# Patient Record
Sex: Male | Born: 1990
Health system: Southern US, Community
[De-identification: ages and names within clinical notes are randomized; demographics above are authoritative.]

---

## 2020-09-14 ENCOUNTER — Other Ambulatory Visit: Payer: Self-pay

## 2020-09-14 ENCOUNTER — Emergency Department (HOSPITAL_BASED_OUTPATIENT_CLINIC_OR_DEPARTMENT_OTHER): Payer: Worker's Compensation

## 2020-09-14 ENCOUNTER — Other Ambulatory Visit (HOSPITAL_BASED_OUTPATIENT_CLINIC_OR_DEPARTMENT_OTHER): Payer: Self-pay | Admitting: Physician Assistant

## 2020-09-14 ENCOUNTER — Encounter (HOSPITAL_BASED_OUTPATIENT_CLINIC_OR_DEPARTMENT_OTHER): Payer: Self-pay | Admitting: Emergency Medicine

## 2020-09-14 ENCOUNTER — Emergency Department (HOSPITAL_BASED_OUTPATIENT_CLINIC_OR_DEPARTMENT_OTHER)
Admission: EM | Admit: 2020-09-14 | Discharge: 2020-09-14 | Disposition: A | Discharge status: Home / Self Care | Payer: Worker's Compensation | Attending: Emergency Medicine | Admitting: Emergency Medicine

## 2020-09-14 DIAGNOSIS — S60932A Unspecified superficial injury of left thumb, initial encounter: Secondary | ICD-10-CM | POA: Diagnosis present

## 2020-09-14 DIAGNOSIS — Y990 Civilian activity done for income or pay: Secondary | ICD-10-CM | POA: Insufficient documentation

## 2020-09-14 DIAGNOSIS — W294XXA Contact with nail gun, initial encounter: Secondary | ICD-10-CM | POA: Diagnosis not present

## 2020-09-14 DIAGNOSIS — S61301A Unspecified open wound of left index finger with damage to nail, initial encounter: Secondary | ICD-10-CM | POA: Insufficient documentation

## 2020-09-14 DIAGNOSIS — S62522A Displaced fracture of distal phalanx of left thumb, initial encounter for closed fracture: Secondary | ICD-10-CM | POA: Diagnosis not present

## 2020-09-14 DIAGNOSIS — F17290 Nicotine dependence, other tobacco product, uncomplicated: Secondary | ICD-10-CM | POA: Diagnosis not present

## 2020-09-14 DIAGNOSIS — S62522B Displaced fracture of distal phalanx of left thumb, initial encounter for open fracture: Secondary | ICD-10-CM

## 2020-09-14 DIAGNOSIS — S6992XA Unspecified injury of left wrist, hand and finger(s), initial encounter: Secondary | ICD-10-CM

## 2020-09-14 MED ORDER — ONDANSETRON 4 MG PO TBDP: 4.0000 mg | ORAL_TABLET | Freq: Once | ORAL | Status: DC | PRN

## 2020-09-14 MED ORDER — CEPHALEXIN 500 MG PO CAPS: 500.0000 mg | ORAL_CAPSULE | Freq: Four times a day (QID) | ORAL | 0 refills | Status: DC

## 2020-09-14 MED ORDER — BUPIVACAINE HCL (PF) 0.5 % IJ SOLN
5.0000 mL | Freq: Once | INTRAMUSCULAR | Status: AC
  Administered 2020-09-14: 5 mL
  Filled 2020-09-14: qty 10

## 2020-09-14 MED ORDER — OXYCODONE-ACETAMINOPHEN 5-325 MG PO TABS
1.0000 | ORAL_TABLET | ORAL | Status: DC | PRN
  Administered 2020-09-14: 1 via ORAL
  Filled 2020-09-14: qty 1

## 2020-09-14 MED ORDER — OXYCODONE-ACETAMINOPHEN 5-325 MG PO TABS
1.0000 | ORAL_TABLET | Freq: Four times a day (QID) | ORAL | 0 refills | Status: DC | PRN
Start: 2020-09-14 — End: 2020-09-14

## 2020-09-14 MED ORDER — LIDOCAINE HCL (PF) 1 % IJ SOLN
5.0000 mL | Freq: Once | INTRAMUSCULAR | Status: AC
  Administered 2020-09-14: 5 mL
  Filled 2020-09-14: qty 5

## 2020-09-14 MED FILL — CEPHALEXIN 500 MG CAPSULE: 500 | 10 days supply | Qty: 40 | Fill #0

## 2020-09-14 MED FILL — OXYCODONE-ACETAMINOPHEN 5-3: 5-325 | 2 days supply | Qty: 10 | Fill #0

## 2020-09-14 NOTE — ED Provider Notes (Signed)
[facility] EMERGENCY DEPARTMENT Provider Note   CSN: [id number] Arrival date & time: 09/14/20  1228     History Chief Complaint  Patient presents with  . Finger Injury    Douglas Harris is a 29 y.o. male with no past medical history who presents today for evaluation of a left thumb injury.  He reports that at 8 AM this morning he accidentally shot his left thumb with a rivet gun at work.  He states this was the third place he has been to.  He had x-rays obtained at urgent care, however they did not send a disc.  Its tetanus was updated at urgent care.  He denies any other injuries.  He reports severe pain in his left thumb, states he has not been given any pain medication, did not eat breakfast or lunch today.    HPI     History reviewed. No pertinent past medical history.  There are no problems to display for this patient.   History reviewed. No pertinent surgical history.     No family history on file.  Social History   Tobacco Use  . Smoking status: Current Every Day Smoker    Types: Cigars  . Smokeless tobacco: Never Used  Substance Use Topics  . Alcohol use: Yes  . Drug use: Not on file    Home Medications Prior to Admission medications   Medication Sig Start Date End Date Taking? Authorizing Provider  cephALEXin (KEFLEX) 500 MG capsule Take 1 capsule (500 mg total) by mouth 4 (four) times daily for 10 days. 09/14/20 09/24/20  Cristina Gong, PA-C  oxyCODONE-acetaminophen (PERCOCET/ROXICET) 5-325 MG tablet Take 1 tablet by mouth every 6 (six) hours as needed for severe pain. 09/14/20   Cristina Gong, PA-C    Allergies    Patient has no known allergies.  Review of Systems   Review of Systems  Constitutional: Negative for chills, fever and unexpected weight change.  Skin: Positive for wound.  Neurological: Negative for weakness and headaches.  All other systems reviewed and are negative.   Physical Exam Updated Vital Signs BP  (!) 145/100   Pulse 83   Temp 98.6 F (37 C)   Resp 18   Ht 5\' 10"  (1.778 m)   Wt 97.5 kg   SpO2 100%   BMI 30.85 kg/m   Physical Exam Vitals and nursing note reviewed.  Constitutional:      General: He is not in acute distress. HENT:     Head: Normocephalic and atraumatic.  Cardiovascular:     Rate and Rhythm: Normal rate.  Pulmonary:     Effort: Pulmonary effort is normal. No respiratory distress.  Abdominal:     General: There is no distension.  Genitourinary:    Comments: Deferred, not pertinent Musculoskeletal:     Cervical back: No rigidity.     Comments: There is obvious crepitus of the distal left thumb.  Otherwise patient has active range of motion of the left thumb at the IP and [facility] joint.  Skin:    Capillary Refill: Capillary refill takes less than 2 seconds.     Comments: Left thumb: There is obvious skin avulsion on the left distal thumb primarily on the radial aspect.  There is continued subungual hematoma  Neurological:     Mental Status: He is alert. Mental status is at baseline.     Sensory: No sensory deficit (Sensation intact to light touch over distal left thumb).     Comments:  Awake and alert, answers all questions appropriately.  Speech is not slurred.    Psychiatric:        Mood and Affect: Mood normal.        Behavior: Behavior normal.             ED Results / Procedures / Treatments   Labs (all labs ordered are listed, but only abnormal results are displayed) Labs Reviewed - No data to display  EKG None  Radiology DG Finger Thumb Left  Result Date: 09/14/2020 CLINICAL DATA:  Left thumb projectile injury EXAM: LEFT THUMB 2+V COMPARISON:  None. FINDINGS: Three view radiograph left thumb demonstrates a comminuted fracture of the radial aspect the distal tuft of the distal phalanx of the thumb. There is an overlying soft tissue defect raising the possibility of an open fracture. Normal overall alignment. Joint spaces have been  preserved. No retained radiopaque foreign body. IMPRESSION: Comminuted fracture of the distal tuft of the distal phalanx of the left thumb, possibly open. Electronically Signed   By: Helyn Numbers MD   On: 09/14/2020 14:07    Procedures .Nerve Block  Date/Time: 09/14/2020 11:17 PM Performed by: Cristina Gong, PA-C Authorized by: Cristina Gong, PA-C   Consent:    Consent obtained:  Verbal   Consent given by:  Patient   Risks discussed:  Allergic reaction, bleeding, infection, intravenous injection, nerve damage, pain, swelling and unsuccessful block   Alternatives discussed:  No treatment and alternative treatment Indications:    Indications:  Pain relief and procedural anesthesia Location:    Body area:  Upper extremity   Upper extremity nerve blocked: Digit, left thumb.   Laterality:  Left Pre-procedure details:    Skin preparation:  2% chlorhexidine   Preparation: Patient was prepped and draped in usual sterile fashion   Procedure details (see MAR for exact dosages):    Block needle gauge:  27 G   Block anesthetic: 50-50 mixture bupivacaine 0.5% and lidocaine 1% both without epi.   Injection procedure:  Anatomic landmarks identified, incremental injection, negative aspiration for blood, introduced needle and anatomic landmarks palpated Post-procedure details:    Dressing:  Sterile dressing   Outcome:  Pain relieved   Patient tolerance of procedure:  Tolerated well, no immediate complications   (including critical care time)  Medications Ordered in ED Medications  bupivacaine (MARCAINE) 0.5 % injection 5 mL (5 mLs Infiltration Given 09/14/20 1347)  lidocaine (PF) (XYLOCAINE) 1 % injection 5 mL (5 mLs Infiltration Given 09/14/20 1347)    ED Course  I have reviewed the triage vital signs and the nursing notes.  Pertinent labs & imaging results that were available during my care of the patient were reviewed by me and considered in my medical decision making  (see chart for details).  Clinical Course as of Sep 14 2318  Mon Sep 14, 2020  1354 Digital block performed.  Patient started getting slightly diaphoretic during injection, laid down and resolved.     [EH]    Clinical Course User Index [EH] Norman Clay   MDM Rules/Calculators/A&P                          Patient is a 29 year old man who presents today for evaluation of a left thumb injury after he accidentally shot a Rivet from a rivet gun through his left thumb.  He had x-rays obtained at outside facility, however these are unable to be viewed.  There is no remaining tissue left to be approximated with sutures.  X-rays were obtained showing a fracture that is comminuted of the distal tuft of the distal phalanx of the left thumb.  Clinically this is an open fracture.  Tetanus was updated at urgent care.  I contacted Dale [place], PA with orthopedics, recommended Keflex and outpatient follow-up. Wound was irrigated with over 1 L of saline with no visualized foreign bodies remaining.  Nonadherent dressing was placed.  Discussed wound care.  Patient is given follow-up with orthopedics.  Digital block was performed while in the emergency room for pain control along with to allow adequate irrigation of the wound.  [facility] PMP is consulted and he is given a prescription for short course of Percocet.  Safety precautions for narcotic discussed and patient states understanding.  Return precautions were discussed with patient who states their understanding.  At the time of discharge patient denied any unaddressed complaints or concerns.  Patient is agreeable for discharge home.  Note: Portions of this report may have been transcribed using voice recognition software. Every effort was made to ensure accuracy; however, inadvertent computerized transcription errors may be present   Final Clinical Impression(s) / ED Diagnoses Final diagnoses:  Open displaced fracture of distal  phalanx of left thumb, initial encounter  Injury of finger of left hand by nail gun, initial encounter  Accident at workplace    Rx / DC Orders ED Discharge Orders         Ordered    cephALEXin (KEFLEX) 500 MG capsule  4 times daily        09/14/20 1531    oxyCODONE-acetaminophen (PERCOCET/ROXICET) 5-325 MG tablet  Every 6 hours PRN        09/14/20 1531           Cristina Gong, PA-C 09/14/20 2321    Benjiman Core, MD 09/15/20 [phone]

## 2020-09-14 NOTE — ED Triage Notes (Signed)
He shot his L thumb with a rivet gun. He was sent from [facility] due to an open fx. He had a drug screen at work.

## 2020-09-14 NOTE — ED Notes (Addendum)
ED Provider at bedside.  Injecting thumb.

## 2020-09-14 NOTE — Discharge Instructions (Addendum)
Do not get your dressing wet.  If you do get it wet or dirty then it is best to change the dressing and put a clean dry dressing on.  Please leave this in place for 48 hours.  If in that time you have not been able to follow-up with a hand specialist you may take the outer dressing off, try to leave the yellow gauze in place and put a new sterile nonstick dressing on.  Today you received medications that may make you sleepy or impair your ability to make decisions.  For the next 24 hours please do not drive, operate heavy machinery, care for a small child with out another adult present, or perform any activities that may cause harm to you or someone else if you were to fall asleep or be impaired.   You are being prescribed a medication which may make you sleepy. Please follow up of listed precautions for at least 24 hours after taking one dose.  You may have diarrhea from the antibiotics.  It is very important that you continue to take the antibiotics even if you get diarrhea unless a medical professional tells you that you may stop taking them.  If you stop too early the bacteria you are being treated for will become stronger and you may need different, more powerful antibiotics that have more side effects and worsening diarrhea.  Please stay well hydrated and consider probiotics as they may decrease the severity of your diarrhea.   Please take Ibuprofen (Advil, motrin) and Tylenol (acetaminophen) to relieve your pain.  You may take up to 600 MG (3 pills) of normal strength ibuprofen every 8 hours as needed.  In between doses of ibuprofen you make take tylenol, up to 1,000 mg (two extra strength pills).  Do not take more than 3,000 mg tylenol in a 24 hour period.  Please check all medication labels as many medications such as pain and cold medications may contain tylenol.  Do not drink alcohol while taking these medications.  Do not take other NSAID'S while taking ibuprofen (such as aleve or naproxen).   Please take ibuprofen with food to decrease stomach upset.

## 2020-09-16 ENCOUNTER — Other Ambulatory Visit: Payer: Self-pay

## 2020-09-16 ENCOUNTER — Ambulatory Visit (HOSPITAL_COMMUNITY): Payer: Worker's Compensation | Admitting: Anesthesiology

## 2020-09-16 ENCOUNTER — Ambulatory Visit (HOSPITAL_COMMUNITY)
Admission: AD | Admit: 2020-09-16 | Discharge: 2020-09-16 | Disposition: A | Discharge status: Home / Self Care | Payer: Worker's Compensation | Source: Ambulatory Visit | Attending: Orthopedic Surgery | Admitting: Orthopedic Surgery

## 2020-09-16 ENCOUNTER — Encounter (HOSPITAL_COMMUNITY)
Admission: AD | Disposition: A | Discharge status: Home / Self Care | Payer: Self-pay | Source: Ambulatory Visit | Attending: Orthopedic Surgery

## 2020-09-16 ENCOUNTER — Encounter (HOSPITAL_COMMUNITY): Payer: Self-pay | Admitting: Orthopedic Surgery

## 2020-09-16 DIAGNOSIS — X58XXXA Exposure to other specified factors, initial encounter: Secondary | ICD-10-CM | POA: Insufficient documentation

## 2020-09-16 DIAGNOSIS — F17290 Nicotine dependence, other tobacco product, uncomplicated: Secondary | ICD-10-CM | POA: Insufficient documentation

## 2020-09-16 DIAGNOSIS — S62522B Displaced fracture of distal phalanx of left thumb, initial encounter for open fracture: Secondary | ICD-10-CM | POA: Diagnosis present

## 2020-09-16 DIAGNOSIS — Z20822 Contact with and (suspected) exposure to covid-19: Secondary | ICD-10-CM | POA: Insufficient documentation

## 2020-09-16 DIAGNOSIS — Y9389 Activity, other specified: Secondary | ICD-10-CM | POA: Insufficient documentation

## 2020-09-16 DIAGNOSIS — S61012A Laceration without foreign body of left thumb without damage to nail, initial encounter: Secondary | ICD-10-CM

## 2020-09-16 HISTORY — PX: INCISION AND DRAINAGE OF WOUND: SHX1803

## 2020-09-16 LAB — CBC
HCT: 46.6 % (ref 39.0–52.0)
Hemoglobin: 15.1 g/dL (ref 13.0–17.0)
MCH: 30.4 pg (ref 26.0–34.0)
MCHC: 32.4 g/dL (ref 30.0–36.0)
MCV: 94 fL (ref 80.0–100.0)
Platelets: 167 10*3/uL (ref 150–400)
RBC: 4.96 MIL/uL (ref 4.22–5.81)
RDW: 13.3 % (ref 11.5–15.5)
WBC: 5.5 10*3/uL (ref 4.0–10.5)
nRBC: 0 % (ref 0.0–0.2)

## 2020-09-16 LAB — SARS CORONAVIRUS 2 BY RT PCR (HOSPITAL ORDER, PERFORMED IN ~~LOC~~ HOSPITAL LAB): SARS Coronavirus 2: NEGATIVE

## 2020-09-16 LAB — SURGICAL PCR SCREEN
MRSA, PCR: NEGATIVE
Staphylococcus aureus: NEGATIVE

## 2020-09-16 SURGERY — IRRIGATION AND DEBRIDEMENT WOUND
Anesthesia: General | Laterality: Left

## 2020-09-16 MED ORDER — MIDAZOLAM HCL 2 MG/2ML IJ SOLN
INTRAMUSCULAR | Status: AC
  Filled 2020-09-16: qty 2

## 2020-09-16 MED ORDER — FENTANYL CITRATE (PF) 100 MCG/2ML IJ SOLN
INTRAMUSCULAR | Status: DC | PRN
  Administered 2020-09-16 (×2): 50 ug via INTRAVENOUS

## 2020-09-16 MED ORDER — OXYCODONE HCL 5 MG PO TABS: 5.0000 mg | ORAL_TABLET | Freq: Once | ORAL | Status: DC | PRN

## 2020-09-16 MED ORDER — BUPIVACAINE HCL (PF) 0.25 % IJ SOLN
INTRAMUSCULAR | Status: DC | PRN
  Administered 2020-09-16: 7 mL

## 2020-09-16 MED ORDER — LACTATED RINGERS IV SOLN: INTRAVENOUS | Status: DC | PRN

## 2020-09-16 MED ORDER — BUPIVACAINE HCL (PF) 0.25 % IJ SOLN
INTRAMUSCULAR | Status: AC
  Filled 2020-09-16: qty 30

## 2020-09-16 MED ORDER — PROPOFOL 10 MG/ML IV BOLUS
INTRAVENOUS | Status: AC
  Filled 2020-09-16: qty 20

## 2020-09-16 MED ORDER — CEFAZOLIN SODIUM-DEXTROSE 2-3 GM-%(50ML) IV SOLR
INTRAVENOUS | Status: DC | PRN
  Administered 2020-09-16: 2 g via INTRAVENOUS

## 2020-09-16 MED ORDER — ONDANSETRON HCL 4 MG/2ML IJ SOLN: 4.0000 mg | Freq: Once | INTRAMUSCULAR | Status: DC | PRN

## 2020-09-16 MED ORDER — CHLORHEXIDINE GLUCONATE 0.12 % MT SOLN
15.0000 mL | Freq: Once | OROMUCOSAL | Status: AC
  Administered 2020-09-16: 15 mL via OROMUCOSAL
  Filled 2020-09-16: qty 15

## 2020-09-16 MED ORDER — FENTANYL CITRATE (PF) 100 MCG/2ML IJ SOLN
INTRAMUSCULAR | Status: DC
Start: 2020-09-16 — End: 2020-09-17
  Filled 2020-09-16: qty 2

## 2020-09-16 MED ORDER — SODIUM CHLORIDE 0.9 % IR SOLN
Status: DC | PRN
  Administered 2020-09-16: 1000 mL

## 2020-09-16 MED ORDER — ORAL CARE MOUTH RINSE: 15.0000 mL | Freq: Once | OROMUCOSAL | Status: AC

## 2020-09-16 MED ORDER — FENTANYL CITRATE (PF) 100 MCG/2ML IJ SOLN
25.0000 ug | INTRAMUSCULAR | Status: DC | PRN
  Administered 2020-09-16 (×2): 50 ug via INTRAVENOUS

## 2020-09-16 MED ORDER — PROPOFOL 10 MG/ML IV BOLUS
INTRAVENOUS | Status: DC | PRN
  Administered 2020-09-16: 200 mg via INTRAVENOUS

## 2020-09-16 MED ORDER — FENTANYL CITRATE (PF) 100 MCG/2ML IJ SOLN: 100.0000 ug | Freq: Once | INTRAMUSCULAR | Status: AC

## 2020-09-16 MED ORDER — CEFAZOLIN SODIUM-DEXTROSE 2-4 GM/100ML-% IV SOLN
2.0000 g | INTRAVENOUS | Status: DC
  Filled 2020-09-16: qty 100

## 2020-09-16 MED ORDER — MIDAZOLAM HCL 5 MG/5ML IJ SOLN
INTRAMUSCULAR | Status: DC | PRN
  Administered 2020-09-16: 2 mg via INTRAVENOUS

## 2020-09-16 MED ORDER — LIDOCAINE HCL (CARDIAC) PF 100 MG/5ML IV SOSY
PREFILLED_SYRINGE | INTRAVENOUS | Status: DC | PRN
  Administered 2020-09-16: 80 mg via INTRAVENOUS

## 2020-09-16 MED ORDER — OXYCODONE HCL 5 MG/5ML PO SOLN: 5.0000 mg | Freq: Once | ORAL | Status: DC | PRN

## 2020-09-16 MED ORDER — LACTATED RINGERS IV SOLN: INTRAVENOUS | Status: DC

## 2020-09-16 MED ORDER — FENTANYL CITRATE (PF) 100 MCG/2ML IJ SOLN
INTRAMUSCULAR | Status: AC
Start: 2020-09-16 — End: 2020-09-16
  Administered 2020-09-16: 100 ug via INTRAVENOUS
  Filled 2020-09-16: qty 2

## 2020-09-16 MED ORDER — FENTANYL CITRATE (PF) 250 MCG/5ML IJ SOLN
INTRAMUSCULAR | Status: AC
  Filled 2020-09-16: qty 5

## 2020-09-16 MED ORDER — ONDANSETRON HCL 4 MG/2ML IJ SOLN
INTRAMUSCULAR | Status: DC | PRN
  Administered 2020-09-16: 4 mg via INTRAVENOUS

## 2020-09-16 SURGICAL SUPPLY — 59 items
BNDG COHESIVE 1X5 TAN STRL LF (GAUZE/BANDAGES/DRESSINGS) ×3 IMPLANT
BNDG CONFORM 2 STRL LF (GAUZE/BANDAGES/DRESSINGS) ×3 IMPLANT
BNDG ELASTIC 3X5.8 VLCR STR LF (GAUZE/BANDAGES/DRESSINGS) ×3 IMPLANT
BNDG ELASTIC 4X5.8 VLCR STR LF (GAUZE/BANDAGES/DRESSINGS) ×3 IMPLANT
BNDG ESMARK 4X9 LF (GAUZE/BANDAGES/DRESSINGS) ×3 IMPLANT
BNDG GAUZE ELAST 4 BULKY (GAUZE/BANDAGES/DRESSINGS) ×3 IMPLANT
CORD BIPOLAR FORCEPS 12FT (ELECTRODE) ×6 IMPLANT
COVER SURGICAL LIGHT HANDLE (MISCELLANEOUS) ×6 IMPLANT
CUFF TOURN SGL QUICK 18X4 (TOURNIQUET CUFF) ×3 IMPLANT
CUFF TOURN SGL QUICK 24 (TOURNIQUET CUFF)
CUFF TRNQT CYL 24X4X16.5-23 (TOURNIQUET CUFF) IMPLANT
DECANTER SPIKE VIAL GLASS SM (MISCELLANEOUS) ×3 IMPLANT
DRAIN PENROSE 1/4X12 LTX STRL (WOUND CARE) IMPLANT
DRAPE SURG 17X23 STRL (DRAPES) ×3 IMPLANT
DRSG ADAPTIC 3X8 NADH LF (GAUZE/BANDAGES/DRESSINGS) ×3 IMPLANT
ELECT REM PT RETURN 9FT ADLT (ELECTROSURGICAL)
ELECTRODE REM PT RTRN 9FT ADLT (ELECTROSURGICAL) IMPLANT
GAUZE SPONGE 4X4 12PLY STRL (GAUZE/BANDAGES/DRESSINGS) ×3 IMPLANT
GAUZE XEROFORM 1X8 LF (GAUZE/BANDAGES/DRESSINGS) IMPLANT
GAUZE XEROFORM 5X9 LF (GAUZE/BANDAGES/DRESSINGS) IMPLANT
GLOVE BIOGEL PI IND STRL 8.5 (GLOVE) ×1 IMPLANT
GLOVE BIOGEL PI INDICATOR 8.5 (GLOVE) ×2
GLOVE SURG ORTHO 8.0 STRL STRW (GLOVE) ×3 IMPLANT
GOWN STRL REUS W/ TWL LRG LVL3 (GOWN DISPOSABLE) ×2 IMPLANT
GOWN STRL REUS W/ TWL XL LVL3 (GOWN DISPOSABLE) ×2 IMPLANT
GOWN STRL REUS W/TWL LRG LVL3 (GOWN DISPOSABLE) ×4
GOWN STRL REUS W/TWL XL LVL3 (GOWN DISPOSABLE) ×4
HANDPIECE INTERPULSE COAX TIP (DISPOSABLE)
KIT BASIN OR (CUSTOM PROCEDURE TRAY) ×3 IMPLANT
KIT TURNOVER KIT B (KITS) ×3 IMPLANT
MANIFOLD NEPTUNE II (INSTRUMENTS) ×3 IMPLANT
NEEDLE HYPO 25GX1X1/2 BEV (NEEDLE) ×3 IMPLANT
NS IRRIG 1000ML POUR BTL (IV SOLUTION) ×3 IMPLANT
PACK ORTHO EXTREMITY (CUSTOM PROCEDURE TRAY) ×3 IMPLANT
PAD ARMBOARD 7.5X6 YLW CONV (MISCELLANEOUS) ×6 IMPLANT
PAD CAST 4YDX4 CTTN HI CHSV (CAST SUPPLIES) ×1 IMPLANT
PADDING CAST COTTON 4X4 STRL (CAST SUPPLIES) ×2
SET CYSTO W/LG BORE CLAMP LF (SET/KITS/TRAYS/PACK) IMPLANT
SET HNDPC FAN SPRY TIP SCT (DISPOSABLE) IMPLANT
SOAP 2 % CHG 4 OZ (WOUND CARE) IMPLANT
SPONGE LAP 18X18 RF (DISPOSABLE) ×3 IMPLANT
SPONGE LAP 4X18 RFD (DISPOSABLE) ×3 IMPLANT
SUT CHROMIC 5 0 P 3 (SUTURE) ×3 IMPLANT
SUT ETHILON 4 0 PS 2 18 (SUTURE) IMPLANT
SUT ETHILON 5 0 P 3 18 (SUTURE) ×2
SUT NYLON ETHILON 5-0 P-3 1X18 (SUTURE) ×1 IMPLANT
SUT PROLENE 4 0 PS 2 18 (SUTURE) ×3 IMPLANT
SUT PROLENE 5 0 P 3 (SUTURE) ×3 IMPLANT
SWAB COLLECTION DEVICE MRSA (MISCELLANEOUS) IMPLANT
SWAB CULTURE ESWAB REG 1ML (MISCELLANEOUS) IMPLANT
SYR BULB IRRIG 60ML STRL (SYRINGE) ×3 IMPLANT
SYR CONTROL 10ML LL (SYRINGE) ×3 IMPLANT
TOWEL GREEN STERILE (TOWEL DISPOSABLE) ×3 IMPLANT
TOWEL GREEN STERILE FF (TOWEL DISPOSABLE) ×3 IMPLANT
TUBE CONNECTING 12'X1/4 (SUCTIONS) ×1
TUBE CONNECTING 12X1/4 (SUCTIONS) ×2 IMPLANT
UNDERPAD 30X36 HEAVY ABSORB (UNDERPADS AND DIAPERS) ×3 IMPLANT
WATER STERILE IRR 1000ML POUR (IV SOLUTION) ×3 IMPLANT
YANKAUER SUCT BULB TIP NO VENT (SUCTIONS) ×3 IMPLANT

## 2020-09-16 NOTE — Discharge Instructions (Addendum)
KEEP BANDAGE CLEAN AND DRY CALL OFFICE FOR F/U APPT [phone] in 8 days rx sent to [facility] [address] KEEP HAND ELEVATED ABOVE HEART OK TO APPLY ICE TO OPERATIVE AREA CONTACT OFFICE IF ANY WORSENING PAIN OR CONCERNS.

## 2020-09-16 NOTE — Op Note (Signed)
PREOPERATIVE DIAGNOSIS: Left thumb open distal phalanx fracture  POSTOPERATIVE DIAGNOSIS: Same  ATTENDING SURGEON: Dr. Bradly Bienenstock who scrubbed and present for the entire procedure  ASSISTANT SURGEON: Lambert Mody, PA-C was scrubbed and necessary for debridement closure and splinting in a timely fashion  ANESTHESIA: General via LMA  OPERATIVE PROCEDURE: #1: Open debridement of skin subcutaneous tissue and bone associated with open fracture left thumb #2: Open treatment of left thumb distal phalanx fracture without internal fixation #3: Left thumb repair nailbed #4: Traumatic laceration repair 2 cm left thumb  IMPLANTS: None  RADIOGRAPHIC INTERPRETATION: None  SURGICAL INDICATIONS: Patient is a right-hand-dominant gentleman who sustained the injury to his left thumb distal tip.  Patient was seen evaluate the office and recommended undergo the above procedure.  The risks of surgery include but not limited to bleeding infection damage nearby nerves arteries or tendons loss of motion of the wrist and digits nailbed deformity need for further surgical intervention.  Signed informed consent was obtained the day of surgery.  SURGICAL TECHNIQUE: Patient was palpated via the preoperative holding area marked per marker made on the left thumb indicate correct operative site.  Patient brought back operating placed supine on anesthesia table where the general anesthetic was administered.  Preoperative antibiotics were given prior to skin incision.  Well-padded tourniquet placed on the left brachium seal with the appropriate drape.  Left upper extremities then prepped and draped in normal sterile fashion.  A timeout was called the correct site was then fine procedure then begun.  Attention then turned to the left thumb.  Excisional debridement of the skin subcutaneous tissue was then done after the tourniquet insufflated.  This was done with sharp scissors knives and rongeurs.  The devitalized tissue of  the subcutaneous tissue all the way down to the bone was then debrided of the open fracture.  Bone fragments that were loose were then removed.  Patient also had the mild comminution of the distal thumb tip.  This was not an unstable fracture and open treatment of the fracture was done without internal fixation.  The wound was then thoroughly irrigated.  After excisional debridement the nailbed was repaired along the radial margin of the nailbed.  This was repaired with simple chromic sutures.  Following this traumatic laceration was then repaired with simple Prolene suture.  Tourniquet was deflated there was good perfusion of the thumb.  The nail plate that had been removed was then placed back underneath the eponychium served as a nice splint for protection of the nailbed repair.  Adaptic dressing sterile compressive bandage then applied.  Patient tolerated the procedure well returned recovery in good condition.  POSTOPERATIVE PLAN: Patient be discharged home.  See him back in the office in 8 days for wound check x-rays down to see our therapist for tip protector splint begin working on gentle range of motion activity as the wound heals.

## 2020-09-16 NOTE — Anesthesia Preprocedure Evaluation (Addendum)
Anesthesia Evaluation  Patient identified by MRN, date of birth, ID band Patient awake    Reviewed: Allergy & Precautions, NPO status , Patient's Chart, lab work & pertinent test results  Airway Mallampati: II  TM Distance: >3 FB Neck ROM: Full    Dental no notable dental hx. (+) Teeth Intact   Pulmonary Current Smoker and Patient abstained from smoking.,    Pulmonary exam normal breath sounds clear to auscultation       Cardiovascular negative cardio ROS Normal cardiovascular exam Rhythm:Regular Rate:Normal     Neuro/Psych negative neurological ROS  negative psych ROS   GI/Hepatic negative GI ROS, Neg liver ROS,   Endo/Other  negative endocrine ROS  Renal/GU negative Renal ROS  negative genitourinary   Musculoskeletal Left thumb laceration with open Fx distal phalanx   Abdominal   Peds  Hematology negative hematology ROS (+)   Anesthesia Other Findings   Reproductive/Obstetrics                             Anesthesia Physical Anesthesia Plan  ASA: II  Anesthesia Plan: General   Post-op Pain Management:    Induction: Intravenous  PONV Risk Score and Plan: 2 and Ondansetron, Midazolam and Treatment may vary due to age or medical condition  Airway Management Planned: LMA  Additional Equipment:   Intra-op Plan:   Post-operative Plan: Extubation in OR  Informed Consent: I have reviewed the patients History and Physical, chart, labs and discussed the procedure including the risks, benefits and alternatives for the proposed anesthesia with the patient or authorized representative who has indicated his/her understanding and acceptance.     Dental advisory given  Plan Discussed with: CRNA and Anesthesiologist  Anesthesia Plan Comments:         Anesthesia Quick Evaluation

## 2020-09-16 NOTE — Anesthesia Procedure Notes (Signed)
Procedure Name: LMA Insertion Date/Time: 09/16/2020 5:52 PM Performed by: Gwenyth Allegra, CRNA Pre-anesthesia Checklist: Patient identified, Emergency Drugs available, Suction available, Patient being monitored and Timeout performed Patient Re-evaluated:Patient Re-evaluated prior to induction Oxygen Delivery Method: Circle system utilized Preoxygenation: Pre-oxygenation with 100% oxygen Induction Type: IV induction LMA: LMA inserted LMA Size: 4.0 Number of attempts: 1 Placement Confirmation: breath sounds checked- equal and bilateral and positive ETCO2 Tube secured with: Tape Dental Injury: Teeth and Oropharynx as per pre-operative assessment

## 2020-09-16 NOTE — Transfer of Care (Addendum)
Immediate Anesthesia Transfer of Care Note  Patient: Douglas Harris  Procedure(s) Performed: IRRIGATION AND DEBRIDEMENT WOUND WITH REPAIR (Left )  Patient Location: PACU  Anesthesia Type:General  Level of Consciousness: awake and alert   Airway & Oxygen Therapy: Patient Spontanous Breathing  Post-op Assessment: Report given to RN and Post -op Vital signs reviewed and stable  Post vital signs: Reviewed and stable  Last Vitals:  Vitals Value Taken Time  BP 119/76   Temp    Pulse 89   Resp 12   SpO2 100     Last Pain:  Vitals:   09/16/20 1314  TempSrc:   PainSc: 10-Worst pain ever      Patients Stated Pain Goal: 2 (09/16/20 1314)  Complications: No complications documented.

## 2020-09-16 NOTE — H&P (Signed)
  Douglas Harris is an 29 y.o. male.   Chief Complaint: LEFT THUMB PAIN  HPI: The patient is a 29y/o right hand dominant male who shot his left thumb with a rivet gun while at work on 09/14/20. He was seen in the ED where the thumb was cleaned and bandaged. He continues to have pain, stiffness, and weakness in the finger.  He came to our office for further treatment. Discussed the reason and rationale for surgery.  He is here today for surgery.  He denies chest pain, shortness of breath, fever, chills, nausea, vomiting, or diarrhea.     No past medical history on file.  No past surgical history on file.  No family history on file. Social History:  reports that he has been smoking cigars. He has never used smokeless tobacco. He reports current alcohol use. No history on file for drug use.  Allergies: No Known Allergies  No medications prior to admission.    No results found for this or any previous visit (from the past 48 hour(s)). DG Finger Thumb Left  Result Date: 09/14/2020 CLINICAL DATA:  Left thumb projectile injury EXAM: LEFT THUMB 2+V COMPARISON:  None. FINDINGS: Three view radiograph left thumb demonstrates a comminuted fracture of the radial aspect the distal tuft of the distal phalanx of the thumb. There is an overlying soft tissue defect raising the possibility of an open fracture. Normal overall alignment. Joint spaces have been preserved. No retained radiopaque foreign body. IMPRESSION: Comminuted fracture of the distal tuft of the distal phalanx of the left thumb, possibly open. Electronically Signed   By: Helyn Numbers MD   On: 09/14/2020 14:07    ROS NO RECENT ILLNESSES OR HOSPITALIZATIONS  There were no vitals taken for this visit. Physical Exam  General Appearance:  Alert, cooperative, no distress, appears stated age  Head:  Normocephalic, without obvious abnormality, atraumatic  Eyes:  Pupils equal, conjunctiva/corneas clear,         Throat: Lips, mucosa, and  tongue normal; teeth and gums normal  Neck: No visible masses     Lungs:   respirations unlabored  Chest Wall:  No tenderness or deformity  Heart:  Regular rate and rhythm,  Abdomen:   Soft, non-tender,         Extremities: LUE - LACERATION TO THE DISTAL PHALANX OF THE THUMB WITHOUT ERYTHEMA OR DRAINAGE. MODERATE SWELLING. CAPILLARY REFILL LESS THAN 2 SECONDS. SENSATION INTACT TO LIGHT TOUCH DISTALLY. ABLE TO GENTLY WIGGLE DIGIT. ALL OTHER DIGITS UNREMARKABLE.  Pulses: 2+ and symmetric  Skin: Skin color, texture, turgor normal, no rashes or lesions     Neurologic: Normal    Assessment/Plan LEFT THUMB OPEN FRACTURE OF THE DISTAL PHALANX   - LEFT THUMB IRRIGATION AND DEBRIDEMENT WITH REPAIR AS INDICATED   R/B/A DISCUSSED WITH PT IN OFFICE.  PT VOICED UNDERSTANDING OF PLAN CONSENT SIGNED DAY OF SURGERY PT SEEN AND EXAMINED PRIOR TO OPERATIVE PROCEDURE/DAY OF SURGERY SITE MARKED. QUESTIONS ANSWERED WILL GO HOME FOLLOWING SURGERY   WE ARE PLANNING SURGERY FOR YOUR UPPER EXTREMITY. THE RISKS AND BENEFITS OF SURGERY INCLUDE BUT NOT LIMITED TO BLEEDING INFECTION, DAMAGE TO NEARBY NERVES ARTERIES TENDONS, FAILURE OF SURGERY TO ACCOMPLISH ITS INTENDED GOALS, PERSISTENT SYMPTOMS AND NEED FOR FURTHER SURGICAL INTERVENTION. WITH THIS IN MIND WE WILL PROCEED. I HAVE DISCUSSED WITH THE PATIENT THE PRE AND POSTOPERATIVE REGIMEN AND THE DOS AND DON'TS. PT VOICED UNDERSTANDING AND INFORMED CONSENT SIGNED.     Karma Greaser 09/16/2020, 10:00 AM

## 2020-09-17 ENCOUNTER — Encounter (HOSPITAL_COMMUNITY): Payer: Self-pay | Admitting: Orthopedic Surgery

## 2020-09-22 NOTE — Anesthesia Postprocedure Evaluation (Signed)
Anesthesia Post Note  Patient: Douglas Harris  Procedure(s) Performed: IRRIGATION AND DEBRIDEMENT WOUND WITH REPAIR (Left )     Patient location during evaluation: PACU Anesthesia Type: General Level of consciousness: awake and alert Pain management: pain level controlled Vital Signs Assessment: post-procedure vital signs reviewed and stable Respiratory status: spontaneous breathing, nonlabored ventilation, respiratory function stable and patient connected to nasal cannula oxygen Cardiovascular status: blood pressure returned to baseline and stable Postop Assessment: no apparent nausea or vomiting Anesthetic complications: no   No complications documented.  Last Vitals:  Vitals:   09/16/20 1856 09/16/20 1913  BP: 115/79 123/88  Pulse: 93 91  Resp: 15 19  Temp: 36.6 C   SpO2: 99% 100%    Last Pain:  Vitals:   09/16/20 1900  TempSrc:   PainSc: 3                  Jodi Criscuolo

## 2020-12-29 DEATH — deceased

## 2021-03-06 IMAGING — DX DG FINGER THUMB 2+V*L*
3 series · 3 of 3 positions shown · non-contrast
Comparison: None.

CLINICAL DATA: Left thumb projectile injury

EXAM:
LEFT THUMB 2+V

[finger ap]
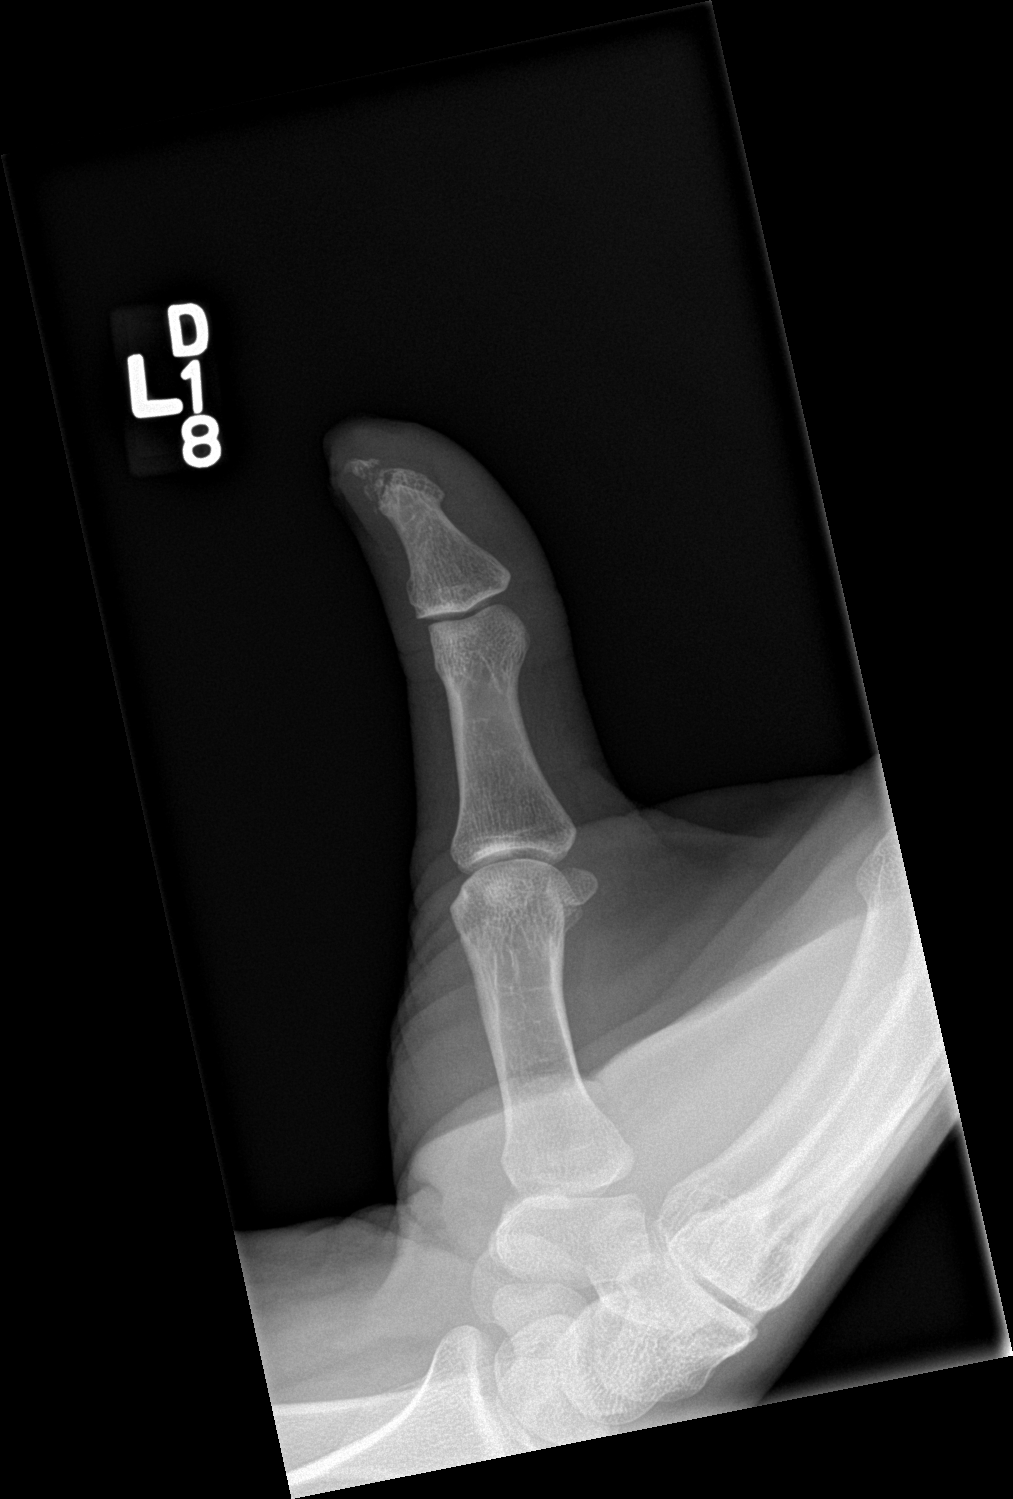

[finger obl]
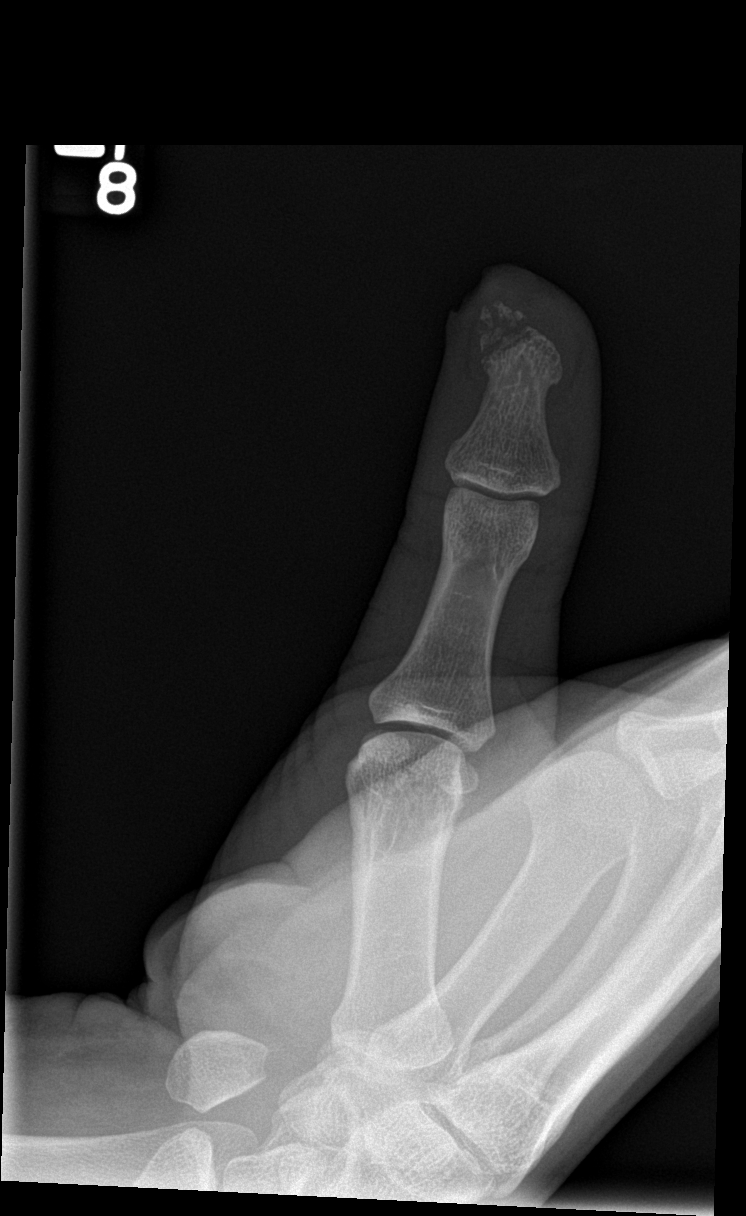

[finger lat]
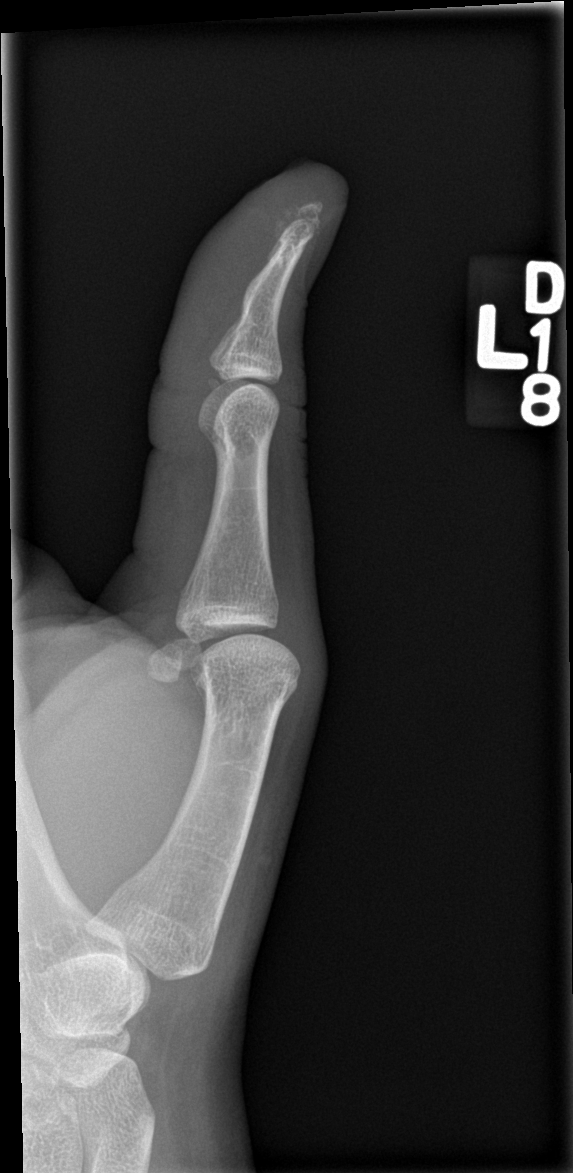

[3 of 3 positions shown; findings below may reference images not displayed]

FINDINGS: Three view radiograph left thumb demonstrates a comminuted fracture
of the radial aspect the distal tuft of the distal phalanx of the
thumb. There is an overlying soft tissue defect raising the
possibility of an open fracture. Normal overall alignment. Joint
spaces have been preserved. No retained radiopaque foreign body.
IMPRESSION: Comminuted fracture of the distal tuft of the distal phalanx of the
left thumb, possibly open.

## 2022-10-28 DIAGNOSIS — Z419 Encounter for procedure for purposes other than remedying health state, unspecified: Secondary | ICD-10-CM | POA: Diagnosis not present

## 2022-11-18 ENCOUNTER — Telehealth: Payer: Self-pay

## 2022-11-18 NOTE — Telephone Encounter (Signed)
LVM. AS, CMA 

## 2022-11-28 DIAGNOSIS — Z419 Encounter for procedure for purposes other than remedying health state, unspecified: Secondary | ICD-10-CM | POA: Diagnosis not present

## 2022-12-29 DIAGNOSIS — Z419 Encounter for procedure for purposes other than remedying health state, unspecified: Secondary | ICD-10-CM | POA: Diagnosis not present

## 2023-01-27 DIAGNOSIS — Z419 Encounter for procedure for purposes other than remedying health state, unspecified: Secondary | ICD-10-CM | POA: Diagnosis not present

## 2023-02-27 DIAGNOSIS — Z419 Encounter for procedure for purposes other than remedying health state, unspecified: Secondary | ICD-10-CM | POA: Diagnosis not present
